# Patient Record
Sex: Male | Born: 1990 | Hispanic: Yes | Marital: Single | Smoking: Former smoker
Health system: Southern US, Community
[De-identification: ages and names within clinical notes are randomized; demographics above are authoritative.]

---

## 2016-04-05 ENCOUNTER — Encounter: Payer: Self-pay | Admitting: *Deleted

## 2016-04-05 ENCOUNTER — Emergency Department (INDEPENDENT_AMBULATORY_CARE_PROVIDER_SITE_OTHER)
Admission: EM | Admit: 2016-04-05 | Discharge: 2016-04-05 | Disposition: A | Discharge status: Home / Self Care | Payer: Self-pay | Source: Home / Self Care | Attending: Family Medicine | Admitting: Family Medicine

## 2016-04-05 DIAGNOSIS — M5442 Lumbago with sciatica, left side: Secondary | ICD-10-CM

## 2016-04-05 DIAGNOSIS — Y990 Civilian activity done for income or pay: Secondary | ICD-10-CM

## 2016-04-05 MED ORDER — MELOXICAM 7.5 MG PO TABS: ORAL_TABLET | ORAL | 0 refills | Status: AC

## 2016-04-05 MED ORDER — CYCLOBENZAPRINE HCL 10 MG PO TABS: 10.0000 mg | ORAL_TABLET | Freq: Two times a day (BID) | ORAL | 0 refills | Status: AC | PRN

## 2016-04-05 MED ORDER — HYDROCODONE-ACETAMINOPHEN 5-325 MG PO TABS: 1.0000 | ORAL_TABLET | Freq: Four times a day (QID) | ORAL | 0 refills | Status: AC | PRN

## 2016-04-05 NOTE — ED Provider Notes (Signed)
CSN: [id number]     Arrival date & time 04/05/16  0914 History   First MD Initiated Contact with Patient 04/05/16 [phone]     Chief Complaint  Patient presents with  . Back Pain   (Consider location/radiation/quality/duration/timing/severity/associated sxs/prior Treatment) HPI  Cindra EvesSalvador Tapanes is a 25 y.o. male presenting to UC with c/o lower back pain that started yesterday at work when he lifted a granite countertop.  He used ice and took Advil and ibuprofen with some relief.  Today, pain is aching and sore, 5/10, worse on Left lower back, occasionally radiating down Left leg.  Certain movements and positions make pain worse.  Denies pain or numbness in groin. Denies change of bowel or bladder habits. Denies numbness down his legs. Hx of herniated L5 several years ago.  He notes he went to an orthopedist who recommended surgery, however, pt decided to go to a chiropractor instead who was able to heal his back pain.  He did have prednisone, a muscle relaxer, and hydrocodone at that time. Pt does not believe the prednisone helped much.     History reviewed. No pertinent past medical history. History reviewed. No pertinent surgical history. Family History  Problem Relation Age of Onset  . Diabetes Mother   . Hypertension Mother    Social History  Substance Use Topics  . Smoking status: Former Games developermoker  . Smokeless tobacco: Never Used  . Alcohol use Yes    Review of Systems  Gastrointestinal: Negative for abdominal pain, nausea and vomiting.  Genitourinary: Negative for dysuria, frequency and hematuria.  Musculoskeletal: Positive for back pain and myalgias. Negative for gait problem, neck pain and neck stiffness.  Neurological: Negative for weakness and numbness.    Allergies  Review of patient's allergies indicates no known allergies.  Home Medications   Prior to Admission medications   Medication Sig Start Date End Date Taking? Authorizing Provider  cyclobenzaprine (FLEXERIL) 10  MG tablet Take 1 tablet (10 mg total) by mouth 2 (two) times daily as needed for muscle spasms. 04/05/16   Junius FinnerErin O'Malley, PA-C  HYDROcodone-acetaminophen (NORCO/VICODIN) 5-325 MG tablet Take 1 tablet by mouth every 6 (six) hours as needed for moderate pain or severe pain. 04/05/16   Junius FinnerErin O'Malley, PA-C  meloxicam (MOBIC) 7.5 MG tablet Take 15mg  (2 tabs) daily for 5 days, then 1-2 tabs daily as needed for pain 04/05/16   Junius FinnerErin O'Malley, PA-C   Meds Ordered and Administered this Visit  Medications - No data to display  BP 117/68 (BP Location: Left Arm)   Pulse 65   Resp 14   Wt 228 lb (103.4 kg)   SpO2 100%  No data found.   Physical Exam  Constitutional: He is oriented to person, place, and time. He appears well-developed and well-nourished.  HENT:  Head: Normocephalic and atraumatic.  Eyes: EOM are normal.  Neck: Normal range of motion. Neck supple.  No midline bone tenderness, no crepitus or step-offs.   Cardiovascular: Normal rate.   Pulmonary/Chest: Effort normal.  Musculoskeletal: Normal range of motion. He exhibits tenderness. He exhibits no edema.  No midline spinal tenderness. Tenderness to Left lower lumbar muscle. Negative straight leg raise. Full ROM upper and lower extremities bilaterally with 5/5 strength.   Neurological: He is alert and oriented to person, place, and time.  Skin: Skin is warm and dry. Capillary refill takes less than 2 seconds. No rash noted. No erythema.  Psychiatric: He has a normal mood and affect. His behavior is normal.  Nursing  note and vitals reviewed.   Urgent Care Course   Clinical Course    Procedures (including critical care time)  Labs Review Labs Reviewed - No data to display  Imaging Review No results found.    MDM   1. Left-sided low back pain with left-sided sciatica   2. Work related injury    Left lower back pain from work-related injury. No red flag symptoms. No indication for imaging at this time.  Will treat as  sciatica from lower back strain.  Rx: Flexeril, Meloxicam, and norco (8 tabs, may take at night for severe pain)   Home care instructions provided.  Return to work with light duty on Monday 8/28. F/u with employee health on Tuesday 8/29 for recheck of symptoms and guidance on when to return to work w/o restrictions. Patient verbalized understanding and agreement with treatment plan.    Junius Finner, PA-C 04/05/16 1056

## 2016-04-05 NOTE — ED Triage Notes (Signed)
Pt works for [employer]. While lifting a countertop @ work yesterday he felt pain in his left lower back. He iced yesterday and last night and has taken Advil and IBF with relief. Pain is worse with sitting. Previous herniated disc @ L5 several years ago.

## 2016-04-05 NOTE — Discharge Instructions (Signed)
Norco/Vicodin (hydrocodone-acetaminophen) is a narcotic pain medication, do not combine these medications with others containing tylenol. While taking, do not drink alcohol, drive, or perform any other activities that requires focus while taking these medications.  ° °Flexeril is a muscle relaxer and may cause drowsiness. Do not drink alcohol, drive, or operate heavy machinery while taking. ° °Meloxicam (Mobic) is an antiinflammatory to help with pain and inflammation.  Do not take ibuprofen, Advil, Aleve, or any other medications that contain NSAIDs while taking meloxicam as this may cause stomach upset or even ulcers if taken in large amounts for an extended period of time.  ° ° °

## 2016-04-09 ENCOUNTER — Other Ambulatory Visit: Payer: Self-pay | Admitting: Emergency Medicine

## 2016-04-09 ENCOUNTER — Ambulatory Visit (INDEPENDENT_AMBULATORY_CARE_PROVIDER_SITE_OTHER): Payer: Self-pay

## 2016-04-09 ENCOUNTER — Emergency Department
Admission: EM | Admit: 2016-04-09 | Discharge: 2016-04-09 | Discharge status: Absent without leave | Payer: Self-pay | Source: Home / Self Care

## 2016-04-09 DIAGNOSIS — T1490XA Injury, unspecified, initial encounter: Secondary | ICD-10-CM

## 2016-04-09 DIAGNOSIS — M5442 Lumbago with sciatica, left side: Secondary | ICD-10-CM

## 2018-01-10 IMAGING — DX DG LUMBAR SPINE COMPLETE 4+V
5 series · 5 of 5 positions shown · non-contrast
Comparison: No prior .

CLINICAL DATA: Back pain that radiates down left leg.

EXAM:
LUMBAR SPINE - COMPLETE 4+ VIEW

[l-spine ap]
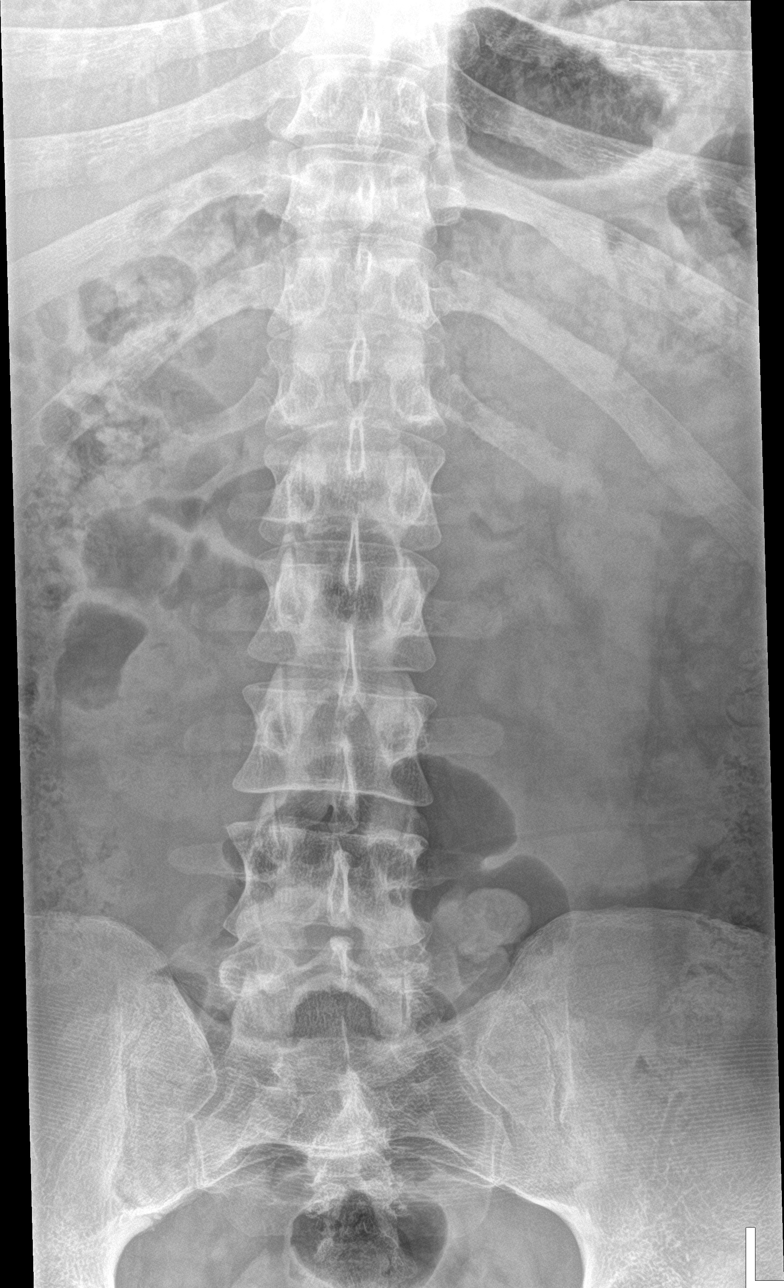

[l-spine obl (1 of 2)]
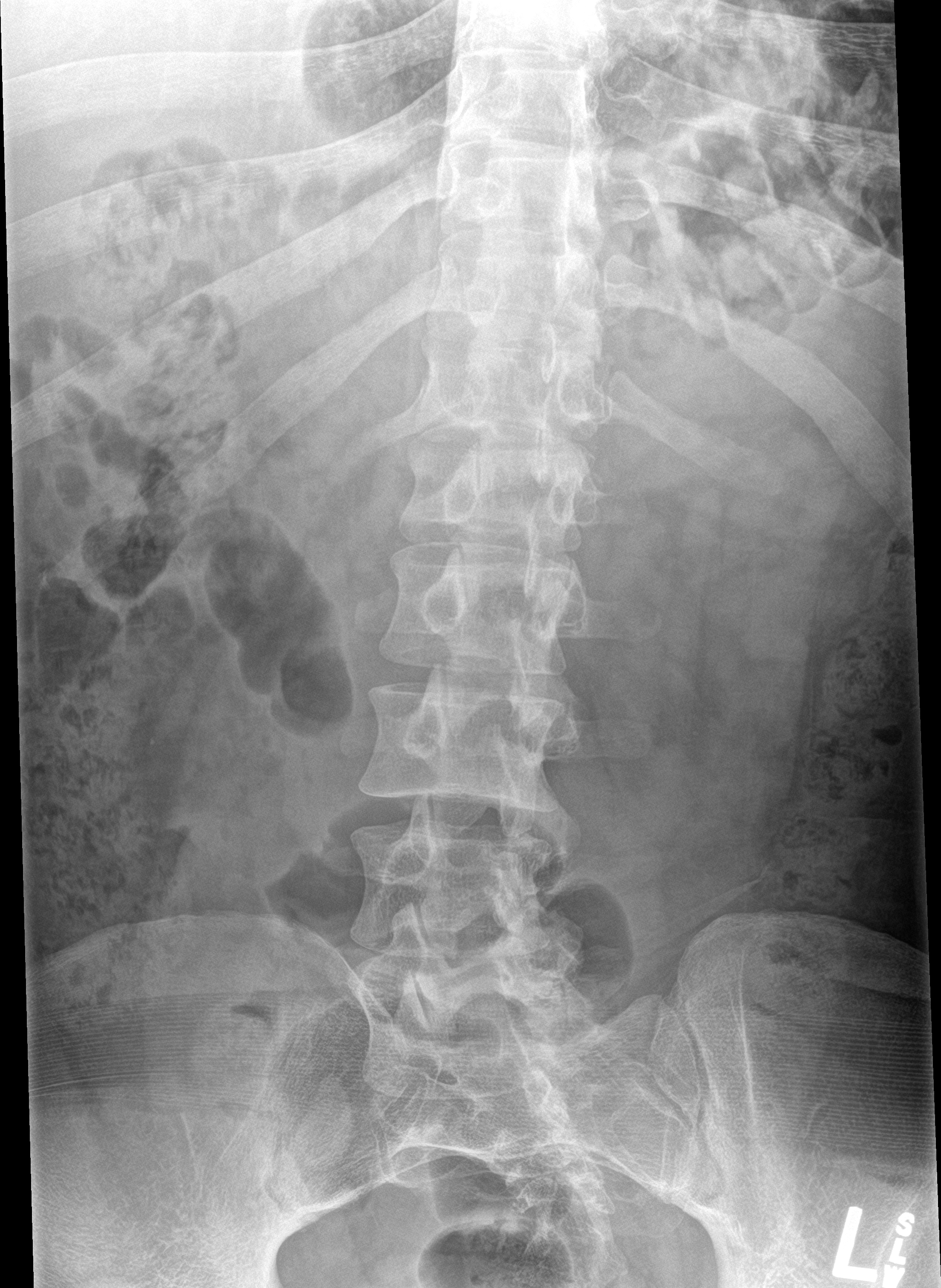

[l-spine obl (2 of 2)]
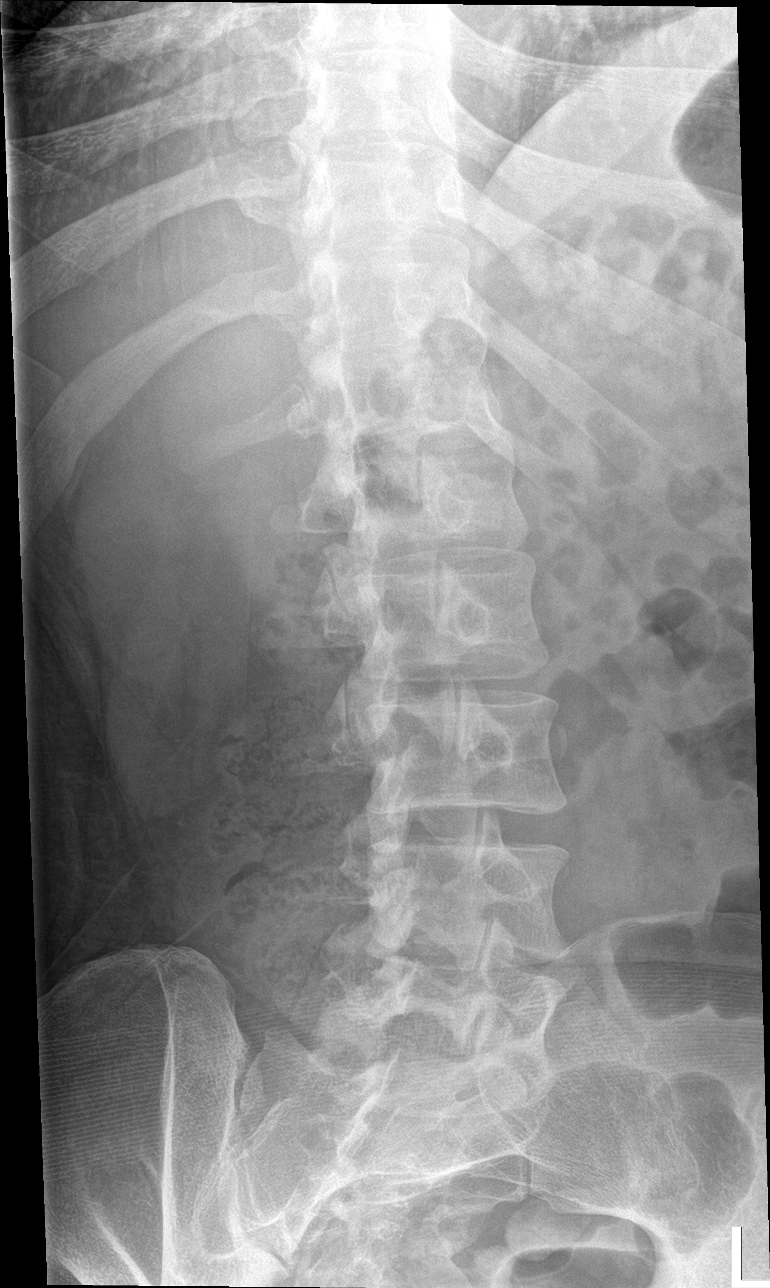

[l-spine lat]
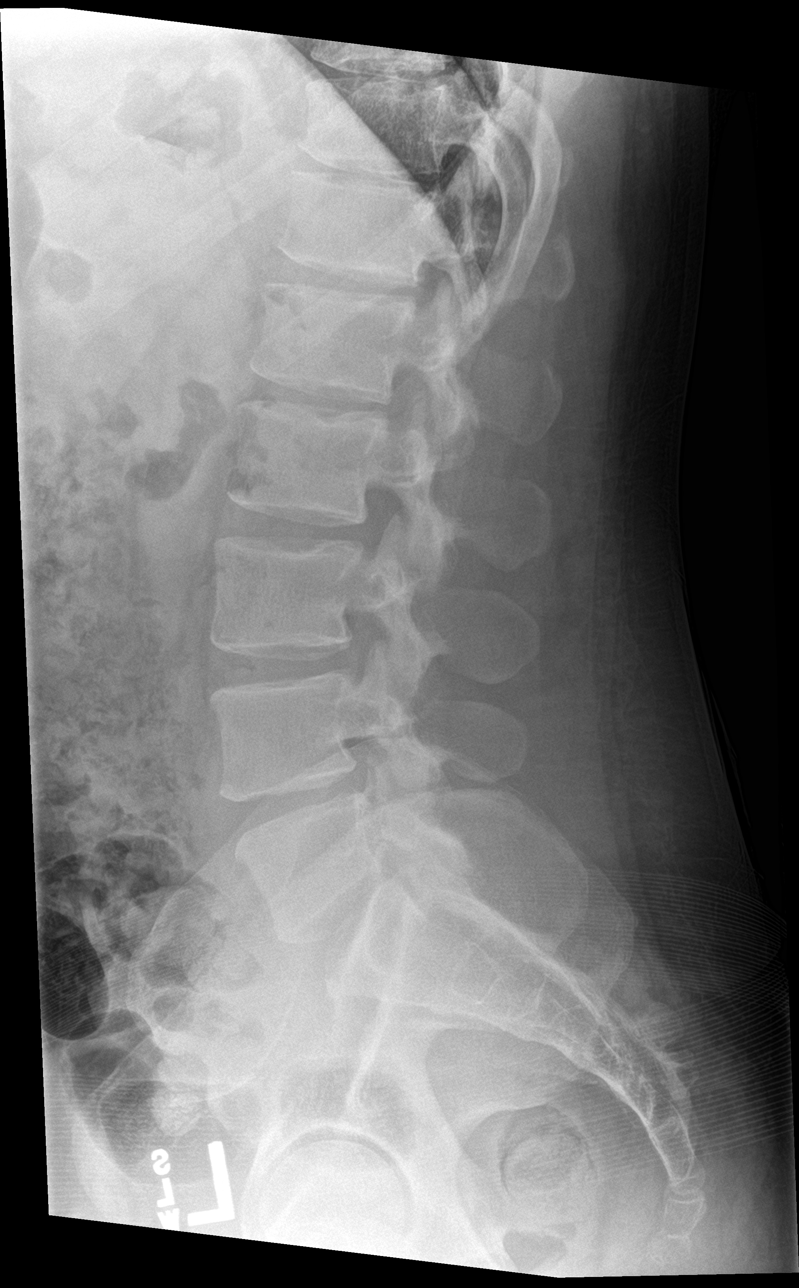

[l-spine spot]
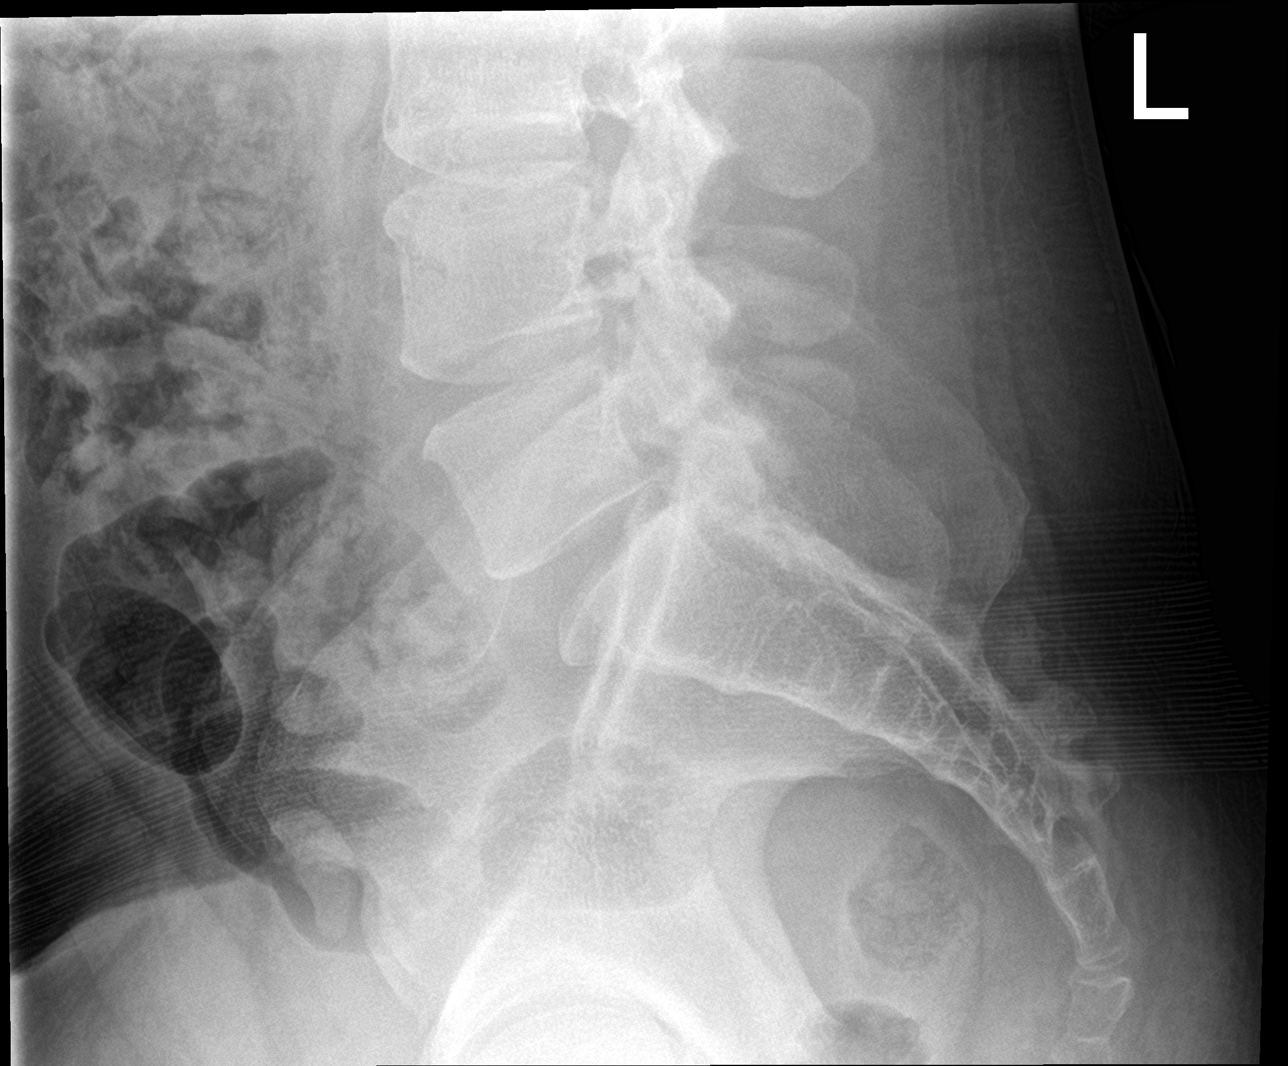

[5 of 5 positions shown; findings below may reference images not displayed]

FINDINGS: No acute bony abnormality identified. Diffuse mild degenerative
change. Normal alignment and mineralization.
IMPRESSION: Diffuse multilevel degenerative change.  No acute abnormality.
